# Patient Record
Sex: Male | Born: 1995 | Race: White | Hispanic: No | Marital: Single | Smoking: Never smoker
Health system: Southern US, Community
[De-identification: ages and names within clinical notes are randomized; demographics above are authoritative.]

## PROBLEM LIST (undated history)

## (undated) DIAGNOSIS — Z6831 Body mass index (BMI) 31.0-31.9, adult: Secondary | ICD-10-CM

## (undated) HISTORY — PX: APPENDECTOMY: SHX54

## (undated) HISTORY — DX: Body mass index (BMI) 31.0-31.9, adult: Z68.31

---
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
## 2020-05-20 ENCOUNTER — Encounter: Payer: Self-pay | Admitting: Emergency Medicine

## 2020-05-20 ENCOUNTER — Other Ambulatory Visit: Payer: Self-pay

## 2020-05-20 ENCOUNTER — Emergency Department
Admission: EM | Admit: 2020-05-20 | Discharge: 2020-05-21 | Disposition: A | Discharge status: Home / Self Care | Payer: BC Managed Care – PPO | Attending: Emergency Medicine | Admitting: Emergency Medicine

## 2020-05-20 ENCOUNTER — Emergency Department: Payer: BC Managed Care – PPO

## 2020-05-20 DIAGNOSIS — R0600 Dyspnea, unspecified: Secondary | ICD-10-CM

## 2020-05-20 DIAGNOSIS — R0602 Shortness of breath: Secondary | ICD-10-CM | POA: Diagnosis present

## 2020-05-20 DIAGNOSIS — U071 COVID-19: Secondary | ICD-10-CM | POA: Insufficient documentation

## 2020-05-20 LAB — CBC WITH DIFFERENTIAL/PLATELET
Abs Immature Granulocytes: 0.02 10*3/uL (ref 0.00–0.07)
Basophils Absolute: 0 10*3/uL (ref 0.0–0.1)
Basophils Relative: 0 %
Eosinophils Absolute: 0 10*3/uL (ref 0.0–0.5)
Eosinophils Relative: 0 %
HCT: 39.5 % (ref 39.0–52.0)
Hemoglobin: 14.7 g/dL (ref 13.0–17.0)
Immature Granulocytes: 1 %
Lymphocytes Relative: 24 %
Lymphs Abs: 0.9 10*3/uL (ref 0.7–4.0)
MCH: 32 pg (ref 26.0–34.0)
MCHC: 37.2 g/dL — ABNORMAL HIGH (ref 30.0–36.0)
MCV: 85.9 fL (ref 80.0–100.0)
Monocytes Absolute: 0.4 10*3/uL (ref 0.1–1.0)
Monocytes Relative: 11 %
Neutro Abs: 2.3 10*3/uL (ref 1.7–7.7)
Neutrophils Relative %: 64 %
Platelets: 100 10*3/uL — ABNORMAL LOW (ref 150–400)
RBC: 4.6 MIL/uL (ref 4.22–5.81)
RDW: 12 % (ref 11.5–15.5)
WBC: 3.5 10*3/uL — ABNORMAL LOW (ref 4.0–10.5)
nRBC: 0 % (ref 0.0–0.2)

## 2020-05-20 LAB — BASIC METABOLIC PANEL
Anion gap: 9 (ref 5–15)
BUN: 15 mg/dL (ref 6–20)
CO2: 26 mmol/L (ref 22–32)
Calcium: 8.5 mg/dL — ABNORMAL LOW (ref 8.9–10.3)
Chloride: 101 mmol/L (ref 98–111)
Creatinine, Ser: 1.17 mg/dL (ref 0.61–1.24)
GFR calc Af Amer: >60 mL/min (ref >60)
GFR calc non Af Amer: >60 mL/min (ref >60)
Glucose, Bld: 118 mg/dL — ABNORMAL HIGH (ref 70–99)
Potassium: 3.7 mmol/L (ref 3.5–5.1)
Sodium: 136 mmol/L (ref 135–145)

## 2020-05-20 LAB — CBC
HCT: 40.7 % (ref 39.0–52.0)
Hemoglobin: 14.7 g/dL (ref 13.0–17.0)
MCH: 31.3 pg (ref 26.0–34.0)
MCHC: 36.1 g/dL — ABNORMAL HIGH (ref 30.0–36.0)
MCV: 86.8 fL (ref 80.0–100.0)
Platelets: 95 10*3/uL — ABNORMAL LOW (ref 150–400)
RBC: 4.69 MIL/uL (ref 4.22–5.81)
RDW: 12 % (ref 11.5–15.5)
WBC: 3.5 10*3/uL — ABNORMAL LOW (ref 4.0–10.5)
nRBC: 0 % (ref 0.0–0.2)

## 2020-05-20 LAB — PROCALCITONIN: Procalcitonin: <0.1 ng/mL

## 2020-05-20 LAB — TROPONIN I (HIGH SENSITIVITY): Troponin I (High Sensitivity): 7 ng/L (ref <18)

## 2020-05-20 MED ORDER — ACETAMINOPHEN 325 MG PO TABS
650.0000 mg | ORAL_TABLET | Freq: Once | ORAL | Status: AC | PRN
  Administered 2020-05-20: 650 mg via ORAL
  Filled 2020-05-20: qty 2

## 2020-05-20 NOTE — ED Triage Notes (Signed)
Pt here for chest tightness and SHOB. Pt is covid positive.  Unlabored in triage, febrile but VSS.  Sats WNL after ambulation to triage area.

## 2020-05-20 NOTE — ED Notes (Signed)
Pt ambulated around room for 5 minutes. Pt O2 sat started at 100% and dropped to 97% after 3 minutes. O2 climbed back to 100% before pt returned to bed. Pt denied any respiratory distress.

## 2020-05-20 NOTE — ED Provider Notes (Signed)
[facility] Emergency Department Provider Note   ____________________________________________   First MD Initiated Contact with Patient 05/20/20 2050     (approximate)  I have reviewed the triage vital signs and the nursing notes.   HISTORY  Chief Complaint Shortness of Breath    HPI Raymond Lowe is a 24 y.o. male who has a known diagnosis of Covid and is feeling short of breath.  He has some chest tightness as well.  Patient's oxygen sats are 97 and 99.  Even with exertion in the emergency room on pulse ox he does not go below 97.  Chest pain does not get any worse.  He is not having any pleuritic pain at all.         History reviewed. No pertinent past medical history.  There are no problems to display for this patient.   Past Surgical History:  Procedure Laterality Date  . APPENDECTOMY      Prior to Admission medications   Not on File   None Allergies Patient has no known allergies.  History reviewed. No pertinent family history.  Social History Social History   Tobacco Use  . Smoking status: Never Smoker  . Smokeless tobacco: Never Used  Substance Use Topics  . Alcohol use: Never  . Drug use: Never    Review of Systems  Constitutional:  fever/chills Eyes: No visual changes. ENT: No sore throat. Cardiovascular: Denies chest pain.  He does associate his chest feels tight he is having some trouble getting air. Respiratory: shortness of breath. Gastrointestinal: No abdominal pain.  No nausea, no vomiting.  No diarrhea.  No constipation. Genitourinary: Negative for dysuria. Musculoskeletal: Negative for back pain. Skin: Negative for rash. Neurological: Negative for headaches, focal weakness   ____________________________________________   PHYSICAL EXAM:  VITAL SIGNS: ED Triage Vitals  Enc Vitals Group     BP 05/20/20 1344 127/66     Pulse Rate 05/20/20 1344 (!) 109     Resp 05/20/20 1344 20     Temp 05/20/20 1344  (!) 100.8 F (38.2 C)     Temp Source 05/20/20 1344 Oral     SpO2 05/20/20 1344 96 %     Weight 05/20/20 1345 225 lb (102.1 kg)     Height 05/20/20 1345 5\' 11"  (1.803 m)     Head Circumference --      Peak Flow --      Pain Score 05/20/20 1345 7     Pain Loc --      Pain Edu? --      Excl. in GC? --     Constitutional: Alert and oriented. Well appearing and in no acute distress. Eyes: Conjunctivae are normal. PER Head: Atraumatic. Nose: No congestion/rhinnorhea. Mouth/Throat: Mucous membranes are moist.  Oropharynx non-erythematous. Neck: No stridor. Cardiovascular: Normal rate, regular rhythm. Grossly normal heart sounds.  Good peripheral circulation. Respiratory: Normal respiratory effort.  No retractions. Lungs CTAB. Gastrointestinal: Soft and nontender. No distention. No abdominal bruits. No CVA tenderness. Musculoskeletal: No lower extremity tenderness nor edema.   Neurologic:  Normal speech and language. No gross focal neurologic deficits are appreciated. No gait instability. Skin:  Skin is warm, dry and intact. No rash noted.   ____________________________________________   LABS (all labs ordered are listed, but only abnormal results are displayed)  Labs Reviewed  BASIC METABOLIC PANEL - Abnormal; Notable for the following components:      Result Value   Glucose, Bld 118 (*)    Calcium  8.5 (*)    All other components within normal limits  CBC - Abnormal; Notable for the following components:   WBC 3.5 (*)    MCHC 36.1 (*)    Platelets 95 (*)    All other components within normal limits  CBC WITH DIFFERENTIAL/PLATELET - Abnormal; Notable for the following components:   WBC 3.5 (*)    MCHC 37.2 (*)    Platelets 100 (*)    All other components within normal limits  PROCALCITONIN  CBC WITH DIFFERENTIAL/PLATELET  TROPONIN I (HIGH SENSITIVITY)  TROPONIN I (HIGH SENSITIVITY)   ____________________________________________  EKG EKG read interpreted by me shows  sinus tachycardia rate of 105 normal axis he does have an S1Q3T3 but this in my experience is not even indicative of PE 50% of the time when a CT angio was done.  Patient has no clinical symptoms of pulmonary embolus patient is generally not tachycardic while in the ER.  In fact while actually in the emergency room on the cardiac monitor it is not tachycardic at all.  Just the pulse ox went up to 114 at 1 point. ____________________________________________  RADIOLOGY Jill Poling, personally viewed and evaluated these images (plain radiographs) as part of my medical decision making, as well as reviewing the written report by the radiologist.  ED MD interpretation: Chest x-ray read by radiology and reviewed by me shows a mild left lower lobe pneumonia.  Official radiology report(s): DG Chest 2 View  Result Date: 05/20/2020 CLINICAL DATA:  Chest tightness, shortness of breath, COVID positive EXAM: CHEST - 2 VIEW COMPARISON:  None. FINDINGS: Mild patchy left lower lobe opacity, suspicious for pneumonia. Right lung is clear. No pleural effusion or pneumothorax. The heart is normal in size. Visualized osseous structures are within normal limits. IMPRESSION: Mild left lower lobe pneumonia in this patient with known COVID. Electronically Signed   By: Charline Bills M.D.   On: 05/20/2020 14:10    ____________________________________________   PROCEDURES  Procedure(s) performed (including Critical Care):  Procedures   ____________________________________________   INITIAL IMPRESSION / ASSESSMENT AND PLAN / ED COURSE  Patient is known Covid positive.  His labs support Covid only.  His procalcitonin is negative his white count is negative.  He is not having any pleuritic chest pain.  He is only mildly short of breath subjectively and objectively does not have any signs of respiratory distress. Patient will return here if he is worse at all.  I have asked the infusion center to evaluate  him for possible monoclonal antibody infusion.         Clinical Course as of May 20 2350  Wynelle Link May 20, 2020  2129 Troponin I (High Sensitivity) [PM]    Clinical Course User Index [PM] Arnaldo Natal, MD     ____________________________________________   FINAL CLINICAL IMPRESSION(S) / ED DIAGNOSES  Final diagnoses:  COVID  Dyspnea, unspecified type     ED Discharge Orders    None      *Please note:  Raymond Lowe was evaluated in Emergency Department on 05/20/2020 for the symptoms described in the history of present illness. He was evaluated in the context of the global COVID-19 pandemic, which necessitated consideration that the patient might be at risk for infection with the SARS-CoV-2 virus that causes COVID-19. Institutional protocols and algorithms that pertain to the evaluation of patients at risk for COVID-19 are in a state of rapid change based on information released by regulatory bodies including the  CDC and federal and [employer]. These policies and algorithms were followed during the patient's care in the ED.  Some ED evaluations and interventions may be delayed as a result of limited staffing during and the pandemic.*   Note:  This document was prepared using Dragon voice recognition software and may include unintentional dictation errors.    Arnaldo Natal, MD 05/20/20 [phone]

## 2020-05-20 NOTE — Discharge Instructions (Addendum)
Please return if worse.  I have asked the infusion center to see if you are a candidate for monoclonal antibody infusion.  If you are they should call you.  The main thing is if you get more short of breath or feel sicker please come back and we will check on you again.

## 2020-05-21 ENCOUNTER — Other Ambulatory Visit: Payer: Self-pay | Admitting: Physician Assistant

## 2020-05-21 ENCOUNTER — Encounter: Payer: Self-pay | Admitting: Physician Assistant

## 2020-05-21 DIAGNOSIS — U071 COVID-19: Secondary | ICD-10-CM

## 2020-05-21 DIAGNOSIS — Z6831 Body mass index (BMI) 31.0-31.9, adult: Secondary | ICD-10-CM

## 2020-05-21 NOTE — Progress Notes (Signed)
I connected by phone with Raymond Lowe on 05/21/2020 at 5:26 PM to discuss the potential use of a new treatment for mild to moderate COVID-19 viral infection in non-hospitalized patients.  This patient is a 24 y.o. male that meets the FDA criteria for Emergency Use Authorization of COVID monoclonal antibody casirivimab/imdevimab.  Has a (+) direct SARS-CoV-2 viral test result  Has mild or moderate COVID-19   Is NOT hospitalized due to COVID-19  Is within 10 days of symptom onset  Has at least one of the high risk factor(s) for progression to severe COVID-19 and/or hospitalization as defined in EUA.  Specific high risk criteria : BMI > 25   I have spoken and communicated the following to the patient or parent/caregiver regarding COVID monoclonal antibody treatment:  1. FDA has authorized the emergency use for the treatment of mild to moderate COVID-19 in adults and pediatric patients with positive results of direct SARS-CoV-2 viral testing who are 35 years of age and older weighing at least 40 kg, and who are at high risk for progressing to severe COVID-19 and/or hospitalization.  2. The significant known and potential risks and benefits of COVID monoclonal antibody, and the extent to which such potential risks and benefits are unknown.  3. Information on available alternative treatments and the risks and benefits of those alternatives, including clinical trials.  4. Patients treated with COVID monoclonal antibody should continue to self-isolate and use infection control measures (e.g., wear mask, isolate, social distance, avoid sharing personal items, clean and disinfect "high touch" surfaces, and frequent handwashing) according to CDC guidelines.   5. The patient or parent/caregiver has the option to accept or refuse COVID monoclonal antibody treatment.  After reviewing this information with the patient, the patient has agreed to receive one of the available covid 19 monoclonal antibodies  and will be provided an appropriate fact sheet prior to infusion.  Sx onset 9/26. Set up for infusion on 10/5 @ 3:30pm. Directions given to [facility]. Pt is aware that insurance will be charged an infusion fee. Pt is unvaccinated.   Cline Crock 05/21/2020 5:26 PM

## 2020-05-22 ENCOUNTER — Ambulatory Visit (HOSPITAL_COMMUNITY)
Admission: RE | Admit: 2020-05-22 | Discharge: 2020-05-22 | Disposition: A | Discharge status: Home / Self Care | Payer: BC Managed Care – PPO | Source: Ambulatory Visit | Attending: Pulmonary Disease | Admitting: Pulmonary Disease

## 2020-05-22 DIAGNOSIS — Z6831 Body mass index (BMI) 31.0-31.9, adult: Secondary | ICD-10-CM | POA: Diagnosis present

## 2020-05-22 DIAGNOSIS — U071 COVID-19: Secondary | ICD-10-CM | POA: Diagnosis not present

## 2020-05-22 MED ORDER — ALBUTEROL SULFATE HFA 108 (90 BASE) MCG/ACT IN AERS: 2.0000 | INHALATION_SPRAY | Freq: Once | RESPIRATORY_TRACT | Status: DC | PRN

## 2020-05-22 MED ORDER — EPINEPHRINE 0.3 MG/0.3ML IJ SOAJ: 0.3000 mg | Freq: Once | INTRAMUSCULAR | Status: DC | PRN

## 2020-05-22 MED ORDER — SODIUM CHLORIDE 0.9 % IV SOLN
1200.0000 mg | Freq: Once | INTRAVENOUS | Status: AC
  Administered 2020-05-22: 1200 mg via INTRAVENOUS

## 2020-05-22 MED ORDER — METHYLPREDNISOLONE SODIUM SUCC 125 MG IJ SOLR: 125.0000 mg | Freq: Once | INTRAMUSCULAR | Status: DC | PRN

## 2020-05-22 MED ORDER — FAMOTIDINE IN NACL 20-0.9 MG/50ML-% IV SOLN: 20.0000 mg | Freq: Once | INTRAVENOUS | Status: DC | PRN

## 2020-05-22 MED ORDER — DIPHENHYDRAMINE HCL 50 MG/ML IJ SOLN: 50.0000 mg | Freq: Once | INTRAMUSCULAR | Status: DC | PRN

## 2020-05-22 MED ORDER — SODIUM CHLORIDE 0.9 % IV SOLN: INTRAVENOUS | Status: DC | PRN

## 2020-05-22 NOTE — Discharge Instructions (Signed)

## 2020-05-22 NOTE — Progress Notes (Signed)
  Diagnosis: COVID-19  Physician: Patrick Wright  Procedure: [facility] Infusion [facility] Med: casirivimab\imdevimab infusion - Provided patient with casirivimab\imdevimab fact sheet for patients, parents and caregivers prior to infusion.  Complications: No immediate complications noted.  Discharge: Discharged home   Dorene Bruni L 05/22/2020  

## 2021-11-17 IMAGING — CR DG CHEST 2V
2 series · 2 of 2 positions shown · non-contrast
Comparison: None.

CLINICAL DATA: Chest tightness, shortness of breath, COVID positive

EXAM:
CHEST - 2 VIEW

[chest pa]
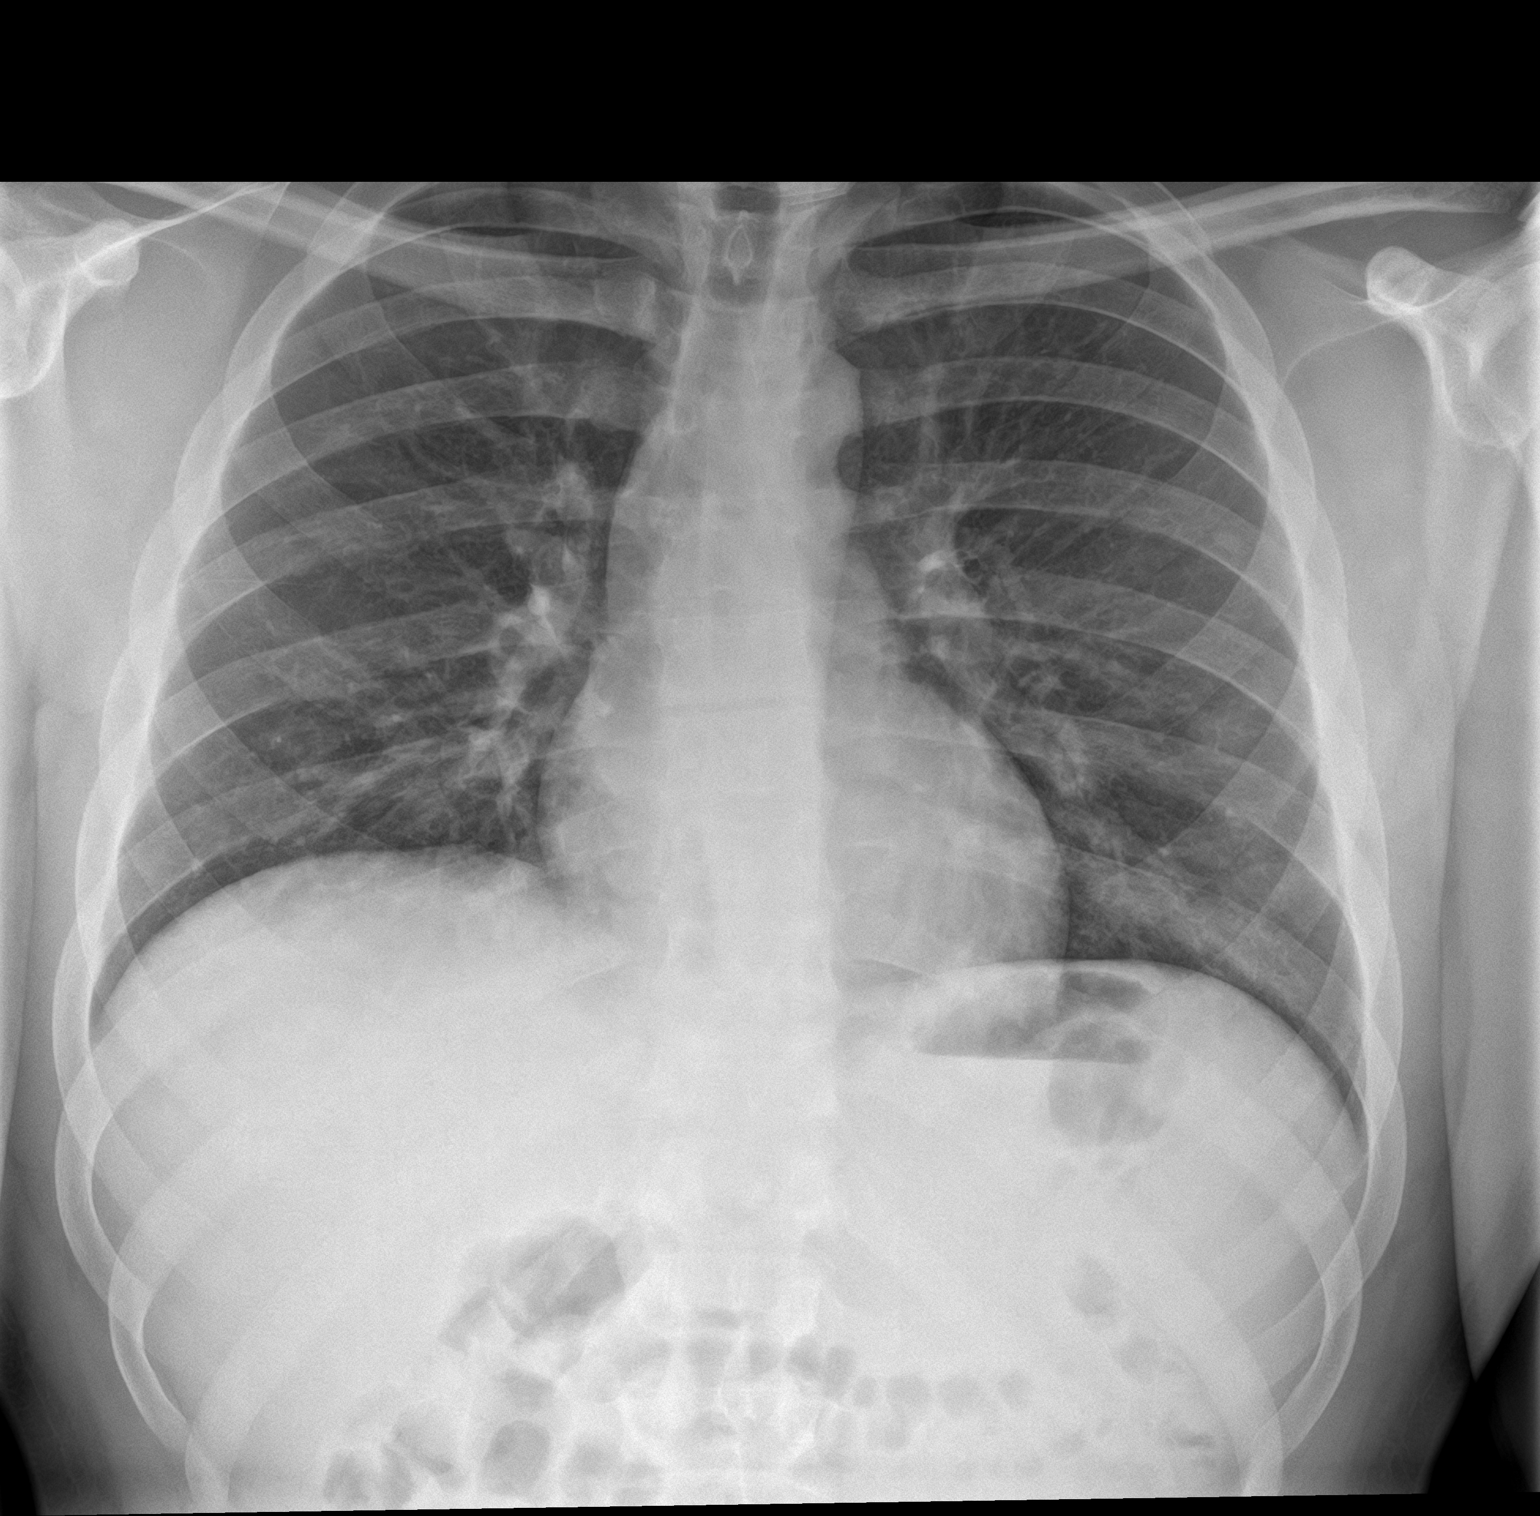

[chest lat]
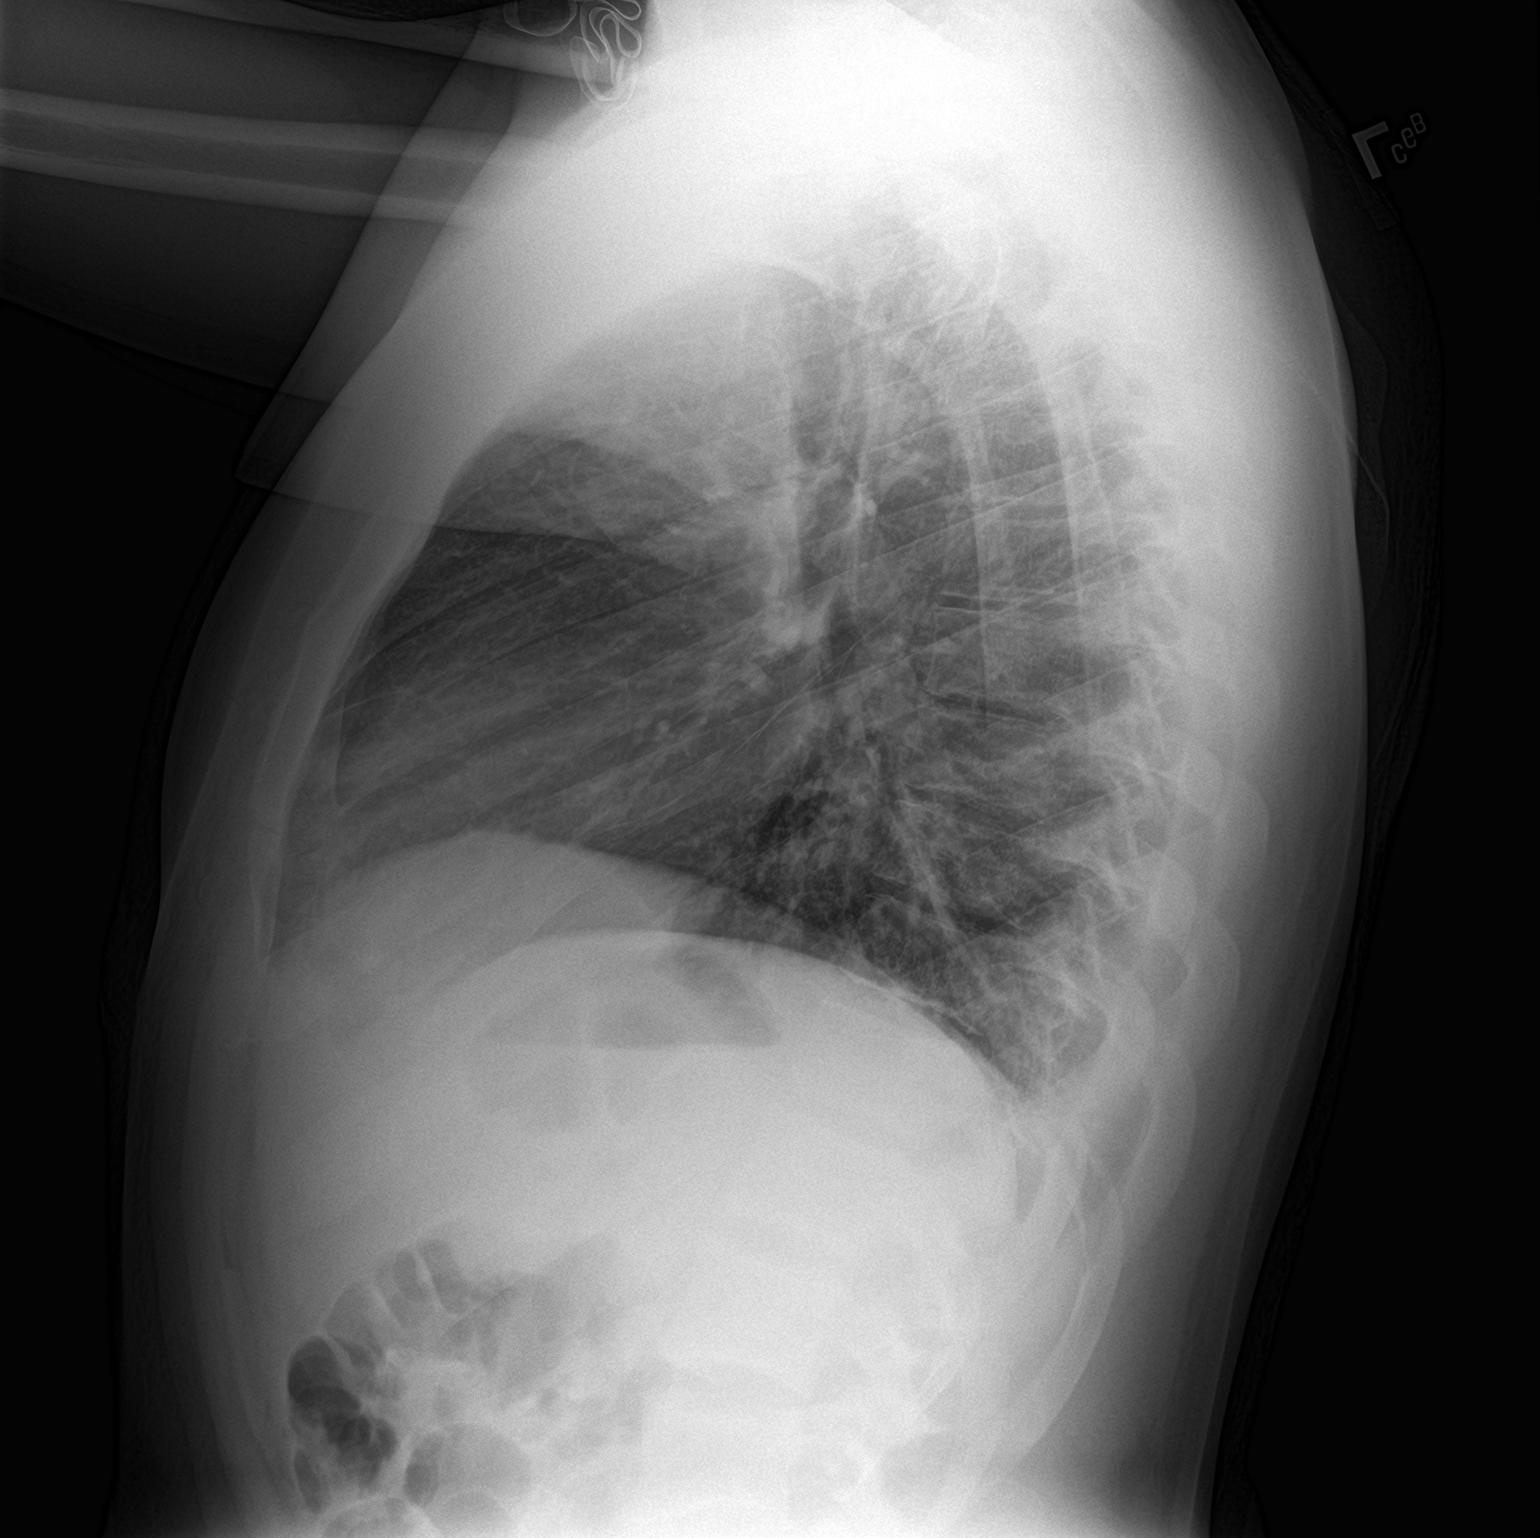

[2 of 2 positions shown; findings below may reference images not displayed]

FINDINGS: Mild patchy left lower lobe opacity, suspicious for pneumonia. Right
lung is clear. No pleural effusion or pneumothorax.

The heart is normal in size.

Visualized osseous structures are within normal limits.
IMPRESSION: Mild left lower lobe pneumonia in this patient with known COVID.
